# Patient Record
Sex: Male | Born: 1980 | Race: Black or African American | Hispanic: No | Marital: Married | Smoking: Current some day smoker
Health system: Southern US, Community
[De-identification: ages and names within clinical notes are randomized; demographics above are authoritative.]

## PROBLEM LIST (undated history)

## (undated) DIAGNOSIS — R519 Headache, unspecified: Secondary | ICD-10-CM

## (undated) DIAGNOSIS — R51 Headache: Secondary | ICD-10-CM

## (undated) HISTORY — PX: LASIK: SHX215

## (undated) HISTORY — PX: TONSILLECTOMY: SUR1361

---

## 2006-02-27 ENCOUNTER — Emergency Department: Payer: Self-pay | Admitting: Emergency Medicine

## 2008-12-22 ENCOUNTER — Emergency Department: Payer: Self-pay | Admitting: Emergency Medicine

## 2011-11-13 ENCOUNTER — Emergency Department: Payer: Self-pay | Admitting: *Deleted

## 2015-09-17 ENCOUNTER — Emergency Department
Admission: EM | Admit: 2015-09-17 | Discharge: 2015-09-17 | Disposition: A | Discharge status: Home / Self Care | Payer: Managed Care, Other (non HMO) | Attending: Emergency Medicine | Admitting: Emergency Medicine

## 2015-09-17 ENCOUNTER — Encounter: Payer: Self-pay | Admitting: *Deleted

## 2015-09-17 ENCOUNTER — Emergency Department: Payer: Managed Care, Other (non HMO)

## 2015-09-17 DIAGNOSIS — F17200 Nicotine dependence, unspecified, uncomplicated: Secondary | ICD-10-CM | POA: Diagnosis not present

## 2015-09-17 DIAGNOSIS — G44019 Episodic cluster headache, not intractable: Secondary | ICD-10-CM | POA: Diagnosis not present

## 2015-09-17 MED ORDER — SUMATRIPTAN 20 MG/ACT NA SOLN: 1.0000 | NASAL | Status: AC | PRN

## 2015-09-17 NOTE — ED Notes (Addendum)
Pt reports  A history of headaches, pt reports he has had a  headache for a week, pt describes headaches are the same as in the past

## 2015-09-17 NOTE — ED Notes (Addendum)
Headache behind right eye everyday since Thanksgiving. Dx with cluster headaches previously. Has followup with headache wellness clinic in [place] X 2 weeks from now.   No headache at this time. Pt alert and oriented X4, active, cooperative, pt in NAD. RR even and unlabored, color WNL.

## 2015-09-17 NOTE — ED Notes (Signed)
Pt discharged home.  Discharge instructions given to patient.  Pt voiced understanding.  No questions or concerns at this time.  Pt in NAD.  Pt items discharged with pt.  No items left in ED.

## 2015-09-17 NOTE — ED Provider Notes (Signed)
[facility] Emergency Department Provider Note     Time seen: ----------------------------------------- 6:22 PM on 09/17/2015 -----------------------------------------    I have reviewed the triage vital signs and the nursing notes.   HISTORY  Chief Complaint Headache    HPI Erskin Zinda is a 34 y.o. male who presents ER for headache every day since Thanksgiving. Patient states it happens at least twice a day, he has diagnosed himself with cluster headache, states the pain is behind his right eye, it tears up and seems to come and go on its own. Over-the-counter migraine medications have not helped. Currently does not have any headache. Patient states is worse when he wakes from sleep at night with headache.   History reviewed. No pertinent past medical history.  There are no active problems to display for this patient.   History reviewed. No pertinent past surgical history.  Allergies Review of patient's allergies indicates no known allergies.  Social History Social History  Substance Use Topics  . Smoking status: Current Some Day Smoker  . Smokeless tobacco: None  . Alcohol Use: No    Review of Systems Constitutional: Negative for fever. Eyes: Negative for visual changes. ENT: Negative for sore throat. Cardiovascular: Negative for chest pain. Respiratory: Negative for shortness of breath. Gastrointestinal: Negative for abdominal pain, vomiting and diarrhea. Genitourinary: Negative for dysuria. Musculoskeletal: Negative for back pain. Skin: Negative for rash. Neurological: Positive for headache  10-point ROS otherwise negative.  ____________________________________________   PHYSICAL EXAM:  VITAL SIGNS: ED Triage Vitals  Enc Vitals Group     BP 09/17/15 1635 140/84 mmHg     Pulse Rate 09/17/15 1635 82     Resp 09/17/15 1635 20     Temp 09/17/15 1635 97.8 F (36.6 C)     Temp Source 09/17/15 1635 Oral     SpO2 09/17/15 1635  100 %     Weight 09/17/15 1635 200 lb (90.719 kg)     Height 09/17/15 1635 6' (1.829 m)     Head Cir --      Peak Flow --      Pain Score 09/17/15 1636 0     Pain Loc --      Pain Edu? --      Excl. in GC? --     Constitutional: Alert and oriented. Well appearing and in no distress. Eyes: Conjunctivae are normal. PERRL. Normal extraocular movements. ENT   Head: Normocephalic and atraumatic.   Nose: No congestion/rhinnorhea.   Mouth/Throat: Mucous membranes are moist.   Neck: No stridor. Cardiovascular: Normal rate, regular rhythm. Normal and symmetric distal pulses are present in all extremities. No murmurs, rubs, or gallops. Respiratory: Normal respiratory effort without tachypnea nor retractions. Breath sounds are clear and equal bilaterally. No wheezes/rales/rhonchi. Gastrointestinal: Soft and nontender. No distention. No abdominal bruits.  Musculoskeletal: Nontender with normal range of motion in all extremities. No joint effusions.  No lower extremity tenderness nor edema. Neurologic:  Normal speech and language. No gross focal neurologic deficits are appreciated. Speech is normal. No gait instability. Skin:  Skin is warm, dry and intact. No rash noted. Psychiatric: Mood and affect are normal. Speech and behavior are normal. Patient exhibits appropriate insight and judgment. ____________________________________________  ED COURSE:  Pertinent labs & imaging results that were available during my care of the patient were reviewed by me and considered in my medical decision making (see chart for details). Patient is in no acute distress, will obtain CT imaging and reevaluate. ____________________________________________  RADIOLOGY  CT head Is unremarkable ____________________________________________  FINAL ASSESSMENT AND PLAN  Headache  Plan: Patient with labs and imaging as dictated above. Patient be given instructions for cluster headache, he has an  appointment with [place] in [place] in 2 weeks. I will try prescription for headache medication until he has time to follow-up.   Emily FilbertWilliams, Phoenyx Paulsen E, MD   Emily FilbertJonathan E Jaquala Fuller, MD 09/17/15 [phone]

## 2015-09-17 NOTE — Discharge Instructions (Signed)
Cluster Headache  Cluster headaches are recognized by their pattern of deep, intense head pain. They normally occur on one side of your head, but they may "switch sides" in subsequent episodes. Typically, cluster headaches:   · Are severe in nature.    · Occur repeatedly over weeks to months and are followed by periods of no headaches.    · Can last from 15 minutes to 3 hours.    · Occur at the same time each day, often at night.    · Occur several times a day.  CAUSES  The exact cause of cluster headaches is not known. Alcohol use may be associated with cluster headaches.  SIGNS AND SYMPTOMS   · Severe pain that begins in or around your eye or temple.    · One-sided head pain.    · Feeling sick to your stomach (nauseous).    · Sensitivity to light.    · Runny nose.    · Eye redness, tearing, and nasal stuffiness on the side of your head where you are experiencing pain.    · Sweaty, pale skin of the face.    · Droopy or swollen eyelid.    · Restlessness.  DIAGNOSIS   Cluster headaches are diagnosed based on symptoms and a physical exam. Your health care provider may order a CT scan or an MRI of your head or lab tests to see if your headaches are caused by other medical conditions.   TREATMENT   · Medicines for pain relief and to prevent recurrent attacks. Some people may need a combination of medicines.  · Oxygen for pain relief.    · Biofeedback programs to help reduce headache pain.    It may be helpful to keep a headache diary. This may help you find a trend for what is triggering your headaches. Your health care provider can develop a treatment plan.   HOME CARE INSTRUCTIONS   During cluster periods:   · Follow a regular sleep schedule. Do not vary the amount and time that you sleep from day to day. It is important to stay on the same schedule during a cluster period to help prevent headaches.    · Avoid alcohol.    · Stop smoking if you smoke.    SEEK MEDICAL CARE IF:  · You have any changes from your previous  cluster headaches either in intensity or frequency.    · You are not getting relief from medicines you are taking.    SEEK IMMEDIATE MEDICAL CARE IF:   · You faint.    · You have weakness or numbness, especially on one side of your body or face.    · You have double vision.    · You have nausea or vomiting that is not relieved within several hours.    · You cannot keep your balance or have difficulty talking or walking.    · You have neck pain or stiffness.    · You have a fever.  MAKE SURE YOU:  · Understand these instructions.    · Will watch your condition.    · Will get help right away if you are not doing well or get worse.     This information is not intended to replace advice given to you by your health care provider. Make sure you discuss any questions you have with your health care provider.     Document Released: 10/04/2005 Document Revised: 07/25/2013 Document Reviewed: 04/26/2013  Elsevier Interactive Patient Education ©2016 [employer]

## 2015-09-20 NOTE — ED Notes (Signed)
Patient called back, states he is out of Imitrex and pharmacy states it is too soon to have refilled. Dr. Mayford KnifeWilliams previously saw patient, is not here today. Spoke to Dr. Pershing ProudSchaevitz, who suggested patient come back to be seen if Imitrex has been ineffective for patient at frequency which patient is taking medication. Patient has follow up with headache [place] in less than 2 weeks. Advised patient he is welcome to return to ER for possible change in medication therapy, or may wait until specialist appointment.

## 2015-09-21 ENCOUNTER — Ambulatory Visit
Admission: EM | Admit: 2015-09-21 | Discharge: 2015-09-21 | Disposition: A | Discharge status: Home / Self Care | Payer: Managed Care, Other (non HMO) | Attending: Internal Medicine | Admitting: Internal Medicine

## 2015-09-21 ENCOUNTER — Encounter: Payer: Self-pay | Admitting: Gynecology

## 2015-09-21 DIAGNOSIS — G4452 New daily persistent headache (NDPH): Secondary | ICD-10-CM | POA: Diagnosis not present

## 2015-09-21 HISTORY — DX: Headache: R51

## 2015-09-21 HISTORY — DX: Headache, unspecified: R51.9

## 2015-09-21 MED ORDER — ZOLMITRIPTAN 5 MG NA SOLN: 1.0000 | NASAL | Status: AC | PRN

## 2015-09-21 MED ORDER — PREDNISONE 50 MG PO TABS: 50.0000 mg | ORAL_TABLET | Freq: Every day | ORAL | Status: AC

## 2015-09-21 NOTE — ED Notes (Signed)
Per patient stated was seen at ER on 09/17/15 for headace. Pt. Was given script for Sumatriptan 20mg  nasal spray Quantity 6. However, per patient uses it all up and was told it was too soon for his refill.

## 2015-09-21 NOTE — ED Provider Notes (Signed)
CSN: [id number]     Arrival date & time 09/21/15  0905 History   First MD Initiated Contact with Patient 09/21/15 1003     Chief Complaint  Patient presents with  . Headache   HPI  Patient is a 34 year old gentleman who started right after Thanksgiving with a daily headache, in the evening, and was evaluated in the emergency room on November 30. He has an appointment [facility] in the next week or so, and has been managing the headaches with daily use of Imitrex nasal, with excellent relief. Unfortunately, his insurance has declined to refill this medicine, since it as a rescue medicine. He is here to discuss options, until he gets into the headache Center. No weakness or clumsiness, currently not having a headache. Was able to walk independently into the urgent care.  Does have some history of headaches, but never this persistent or severe.    Past Medical History  Diagnosis Date  . Head ache    Past Surgical History  Procedure Laterality Date  . Tonsillectomy    . Lasik      Social History  Substance Use Topics  . Smoking status: Current Some Day Smoker  . Smokeless tobacco: None  . Alcohol Use: No    Review of Systems  All other systems reviewed and are negative.   Allergies  Review of patient's allergies indicates no known allergies.  Home Medications   Prior to Admission medications   Medication Sig Start Date End Date Taking? Authorizing Provider  SUMAtriptan (IMITREX) 20 MG/ACT nasal spray Place 1 spray (20 mg total) into the nose as needed for migraine or headache. May repeat in 2 hours if headache persists or recurs. 09/17/15  Yes Emily FilbertJonathan E Williams, MD                  BP 112/80 mmHg  Pulse 69  Temp(Src) 97.8 F (36.6 C) (Oral)  Resp 18  Ht 6' (1.829 m)  Wt 200 lb (90.719 kg)  BMI 27.12 kg/m2  SpO2 100%   Physical Exam  Constitutional: He is oriented to person, place, and time. No distress.  Alert, nicely  groomed Sitting up on the end of the exam table  HENT:  Head: Atraumatic.  Eyes:  Conjugate gaze, no eye redness/drainage  Neck: Neck supple.  Cardiovascular: Normal rate.   Pulmonary/Chest: No respiratory distress.  Abdominal: He exhibits no distension.  Musculoskeletal: Normal range of motion. He exhibits no edema.  Neurological: He is alert and oriented to person, place, and time.  Able to climb on/off exam table without difficulty, walked into the urgent care independently.  Skin: Skin is warm and dry.  No cyanosis  Nursing note and vitals reviewed.   ED Course  Procedures (including critical care time)    MDM   1. New persistent daily headache    New Prescriptions   PREDNISONE (DELTASONE) 50 MG TABLET    Take 1 tablet (50 mg total) by mouth daily.   ZOLMITRIPTAN (ZOMIG) 5 MG NASAL SOLUTION    Place 1 spray into the nose as needed for migraine.   Keep follow-up as planned with [facility]. Prescription for prednisone to try and break the headache, and prescription for alternate triptan given. Establish care with a primary care provider, given the name of Dr. Everlene OtherJayce Cook at [facility].     Eustace MooreLaura W Murray, MD 09/22/15 [phone]

## 2015-09-21 NOTE — Discharge Instructions (Signed)
Followup as planned with [facility].   Prescriptions for zomig and prednisone (to break headache cycle) sent to the [facility] in [place]. Recheck as needed.

## 2017-06-11 IMAGING — CT CT HEAD W/O CM
1 series · 16 of 30 positions shown, 20 images · non-contrast
Comparison: None.

CLINICAL DATA: Headaches behind the right eye since Thanksgiving.

EXAM:
CT HEAD WITHOUT CONTRAST
TECHNIQUE: Contiguous axial images were obtained from the base of the skull
through the vertex without intravenous contrast.

[Series 2: head wo · axial · 0.46mm/px · z∈[-31,+100]mm · 16 of 30 slices shown, 20 images]
[im 2/30  brain]
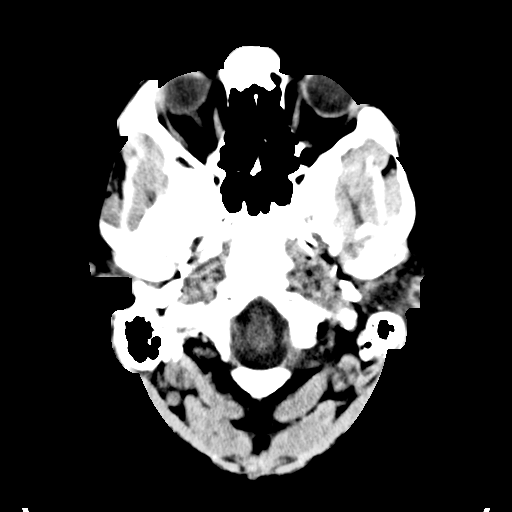
[im 2/30  bone]
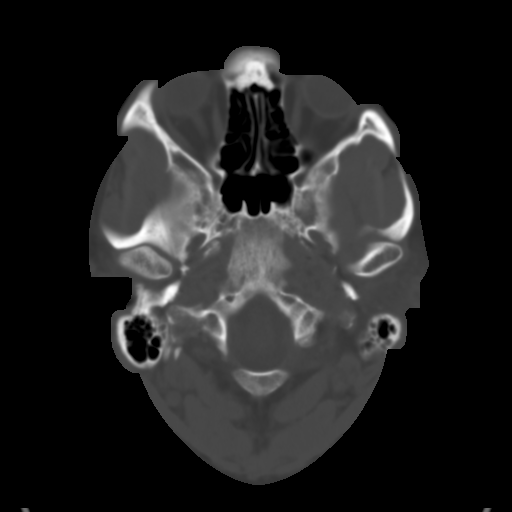
[im 4/30  brain]
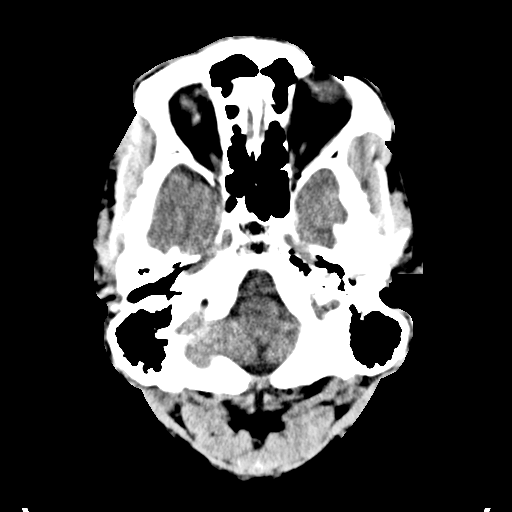
[im 6/30  brain]
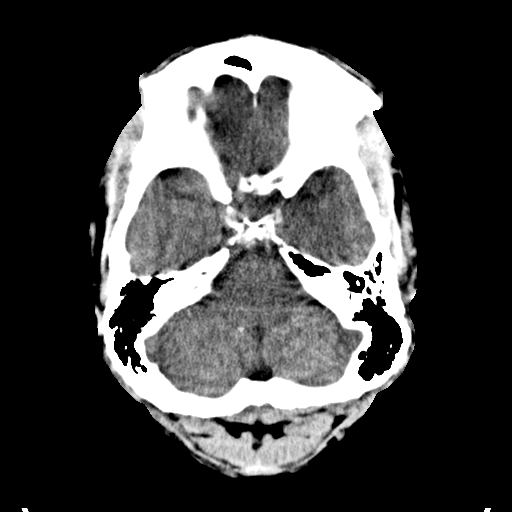
[im 8/30  brain]
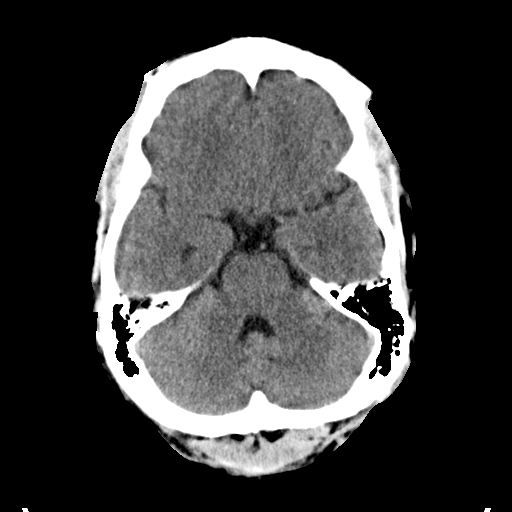
[im 9/30  brain]
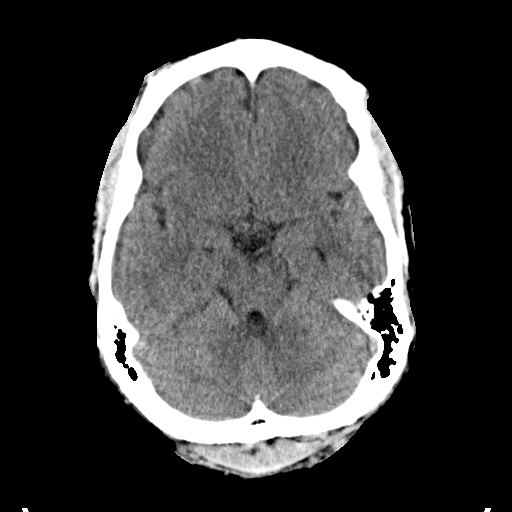
[im 9/30  bone]
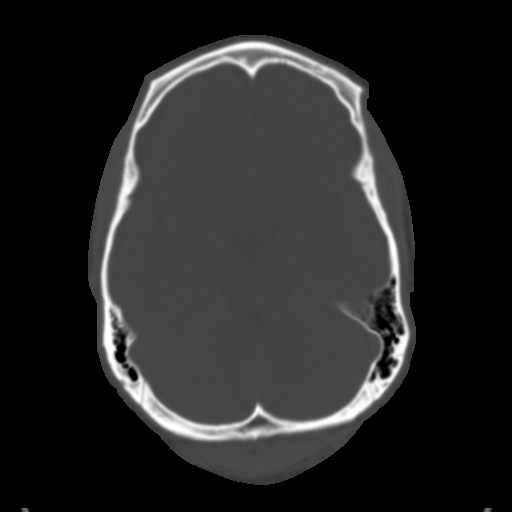
[im 11/30  brain]
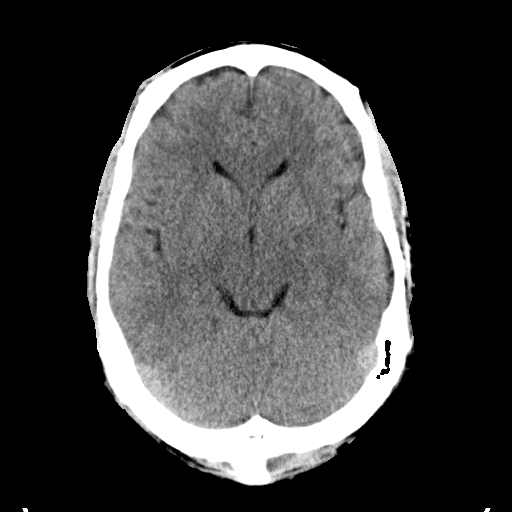
[im 13/30  brain]
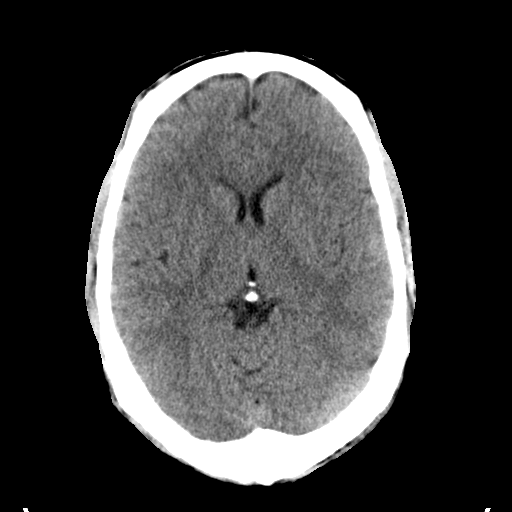
[im 15/30  brain]
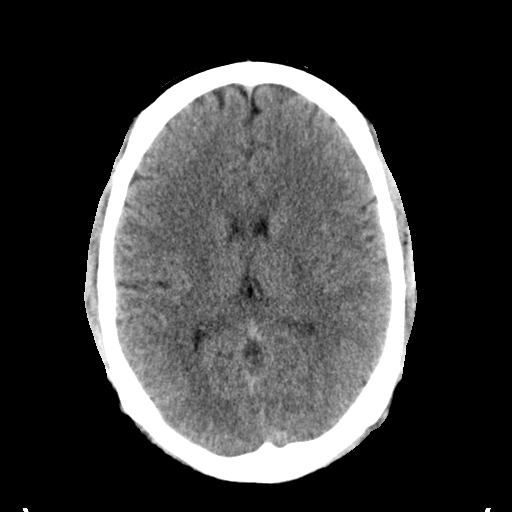
[im 16/30  brain]
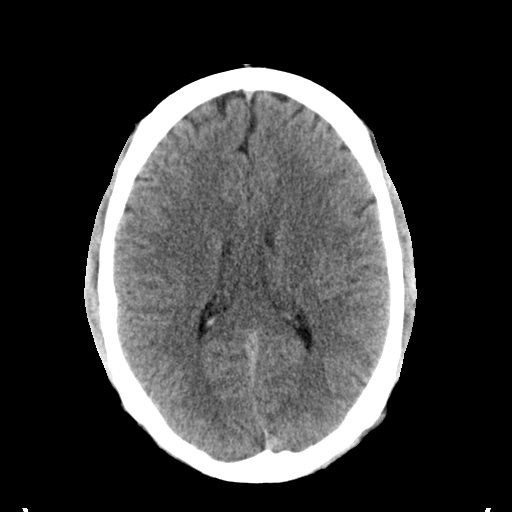
[im 16/30  bone]
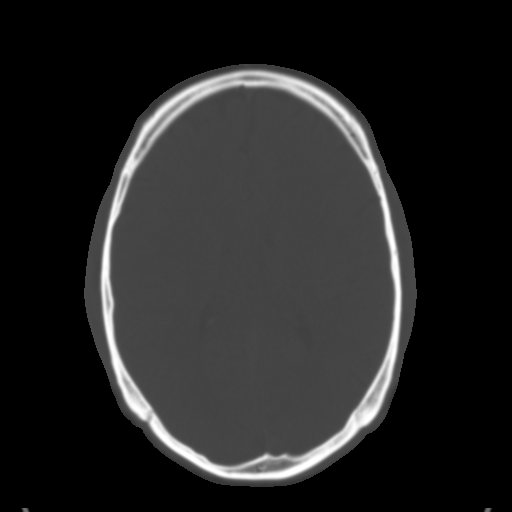
[im 18/30  brain]
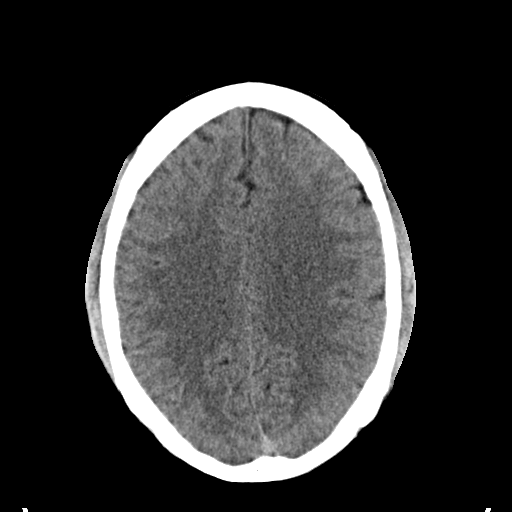
[im 20/30  brain]
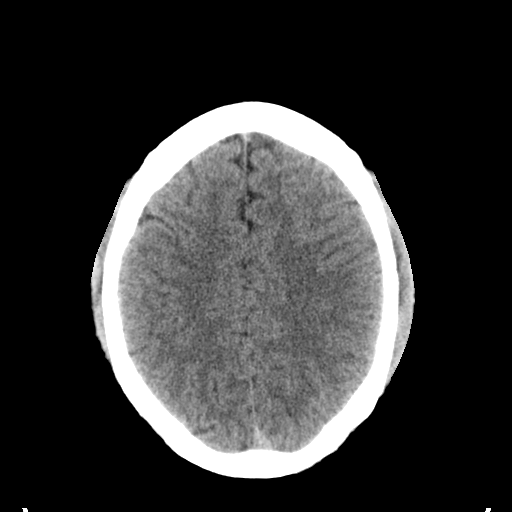
[im 22/30  brain]
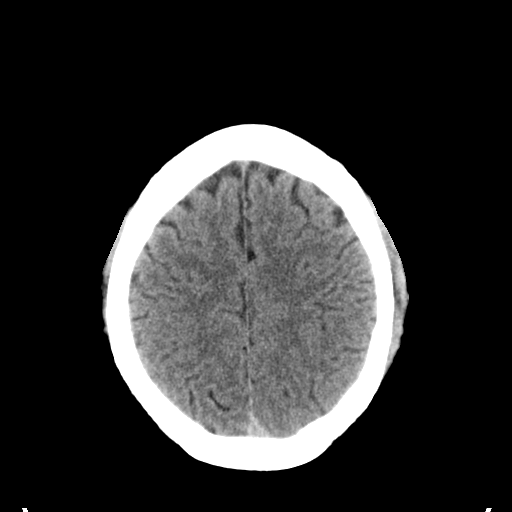
[im 23/30  brain]
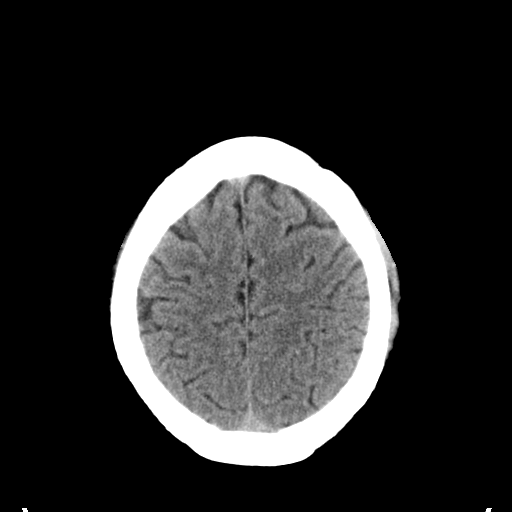
[im 23/30  bone]
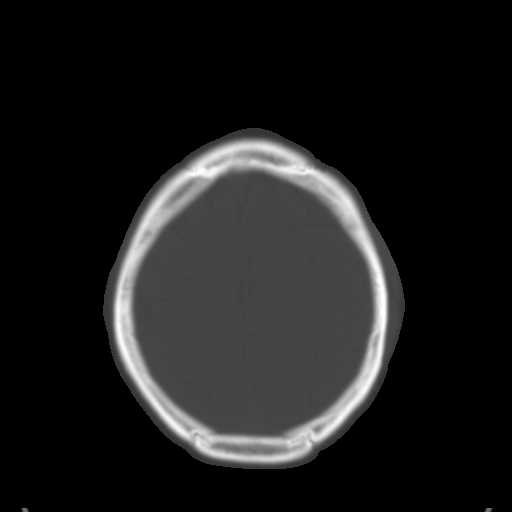
[im 25/30  brain]
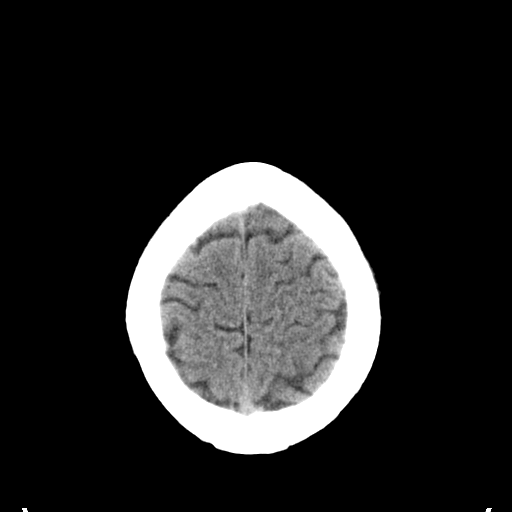
[im 27/30  brain]
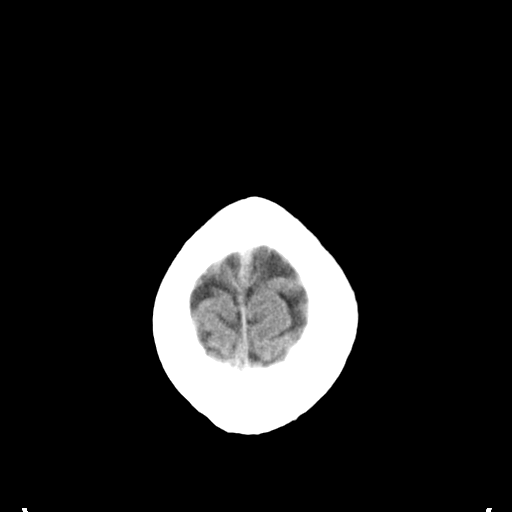
[im 29/30  brain]
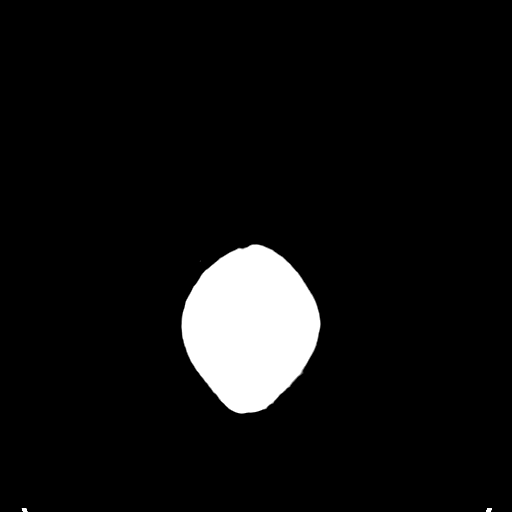

[16 of 30 positions shown; findings below may reference images not displayed]

FINDINGS: There is no midline shift, hydrocephalus, or mass. No acute
hemorrhage or acute transcortical infarct is identified. The bony
calvarium is intact. The visualized sinuses are clear.
IMPRESSION: Normal head CT.

## 2017-11-27 ENCOUNTER — Emergency Department
Admission: EM | Admit: 2017-11-27 | Discharge: 2017-11-27 | Disposition: A | Discharge status: Home / Self Care | Payer: Commercial Managed Care - PPO | Attending: Emergency Medicine | Admitting: Emergency Medicine

## 2017-11-27 DIAGNOSIS — Y929 Unspecified place or not applicable: Secondary | ICD-10-CM | POA: Diagnosis not present

## 2017-11-27 DIAGNOSIS — S3992XA Unspecified injury of lower back, initial encounter: Secondary | ICD-10-CM | POA: Diagnosis present

## 2017-11-27 DIAGNOSIS — Y939 Activity, unspecified: Secondary | ICD-10-CM | POA: Diagnosis not present

## 2017-11-27 DIAGNOSIS — F17210 Nicotine dependence, cigarettes, uncomplicated: Secondary | ICD-10-CM | POA: Diagnosis not present

## 2017-11-27 DIAGNOSIS — Y999 Unspecified external cause status: Secondary | ICD-10-CM | POA: Diagnosis not present

## 2017-11-27 DIAGNOSIS — S39012A Strain of muscle, fascia and tendon of lower back, initial encounter: Secondary | ICD-10-CM | POA: Diagnosis not present

## 2017-11-27 DIAGNOSIS — X58XXXA Exposure to other specified factors, initial encounter: Secondary | ICD-10-CM | POA: Diagnosis not present

## 2017-11-27 MED ORDER — DEXAMETHASONE SODIUM PHOSPHATE 10 MG/ML IJ SOLN
10.0000 mg | Freq: Once | INTRAMUSCULAR | Status: AC
  Administered 2017-11-27: 10 mg via INTRAMUSCULAR
  Filled 2017-11-27: qty 1

## 2017-11-27 MED ORDER — IBUPROFEN 800 MG PO TABS: 800.0000 mg | ORAL_TABLET | Freq: Three times a day (TID) | ORAL | 0 refills | Status: AC | PRN

## 2017-11-27 MED ORDER — CYCLOBENZAPRINE HCL 5 MG PO TABS: 5.0000 mg | ORAL_TABLET | Freq: Three times a day (TID) | ORAL | 0 refills | Status: AC | PRN

## 2017-11-27 MED ORDER — KETOROLAC TROMETHAMINE 30 MG/ML IJ SOLN
30.0000 mg | Freq: Once | INTRAMUSCULAR | Status: AC
  Administered 2017-11-27: 30 mg via INTRAMUSCULAR
  Filled 2017-11-27: qty 1

## 2017-11-27 MED ORDER — DIAZEPAM 5 MG PO TABS
5.0000 mg | ORAL_TABLET | Freq: Once | ORAL | Status: AC
  Administered 2017-11-27: 5 mg via ORAL
  Filled 2017-11-27: qty 1

## 2017-11-27 NOTE — ED Provider Notes (Signed)
[facility] EMERGENCY DEPARTMENT Provider Note   CSN: [id number] Arrival date & time: 11/27/17  1701     History   Chief Complaint Chief Complaint  Patient presents with  . Back Pain    HPI Stephen Dodson is a 37 y.o. male presents to the emergency department for evaluation of acute right lower back pain.  Patient states Friday he sneezed and felt a pulling sensation on the right lower back of the lumbar spine.  He denies any midline back pain.  He does describe tightness over the last 2 days without any numbness tingling or radicular symptoms.  He has been ambulatory.  He states he has no pain with sitting still but with attempting to lean to the left he has tightness on the right paravertebral muscles of the lumbar spine with moderate pain.  He has not had any medications for the pain.  He has been using icy hot with mild improvement.  He denies any urinary symptoms, abdominal pain, chest pain or shortness of breath.  HPI  Past Medical History:  Diagnosis Date  . Head ache     There are no active problems to display for this patient.   Past Surgical History:  Procedure Laterality Date  . LASIK    . TONSILLECTOMY         Home Medications    Prior to Admission medications   Medication Sig Start Date End Date Taking? Authorizing Provider  cyclobenzaprine (FLEXERIL) 5 MG tablet Take 1-2 tablets (5-10 mg total) by mouth 3 (three) times daily as needed for muscle spasms. 11/27/17   Evon Slack, PA-C  ibuprofen (ADVIL,MOTRIN) 800 MG tablet Take 1 tablet (800 mg total) by mouth every 8 (eight) hours as needed. 11/27/17   Evon Slack, PA-C  predniSONE (DELTASONE) 50 MG tablet Take 1 tablet (50 mg total) by mouth daily. 09/21/15   Isa Rankin, MD  SUMAtriptan [facility]) 20 MG/ACT nasal spray Place 1 spray (20 mg total) into the nose as needed for migraine or headache. May repeat in 2 hours if headache persists or recurs. 09/17/15   Emily Filbert, MD  zolmitriptan (ZOMIG) 5 MG nasal solution Place 1 spray into the nose as needed for migraine. 09/21/15   Isa Rankin, MD    Family History History reviewed. No pertinent family history.  Social History Social History   Tobacco Use  . Smoking status: Current Some Day Smoker  . Smokeless tobacco: Never Used  Substance Use Topics  . Alcohol use: No  . Drug use: No     Allergies   Patient has no known allergies.   Review of Systems Review of Systems  Constitutional: Negative for fever.  Respiratory: Negative for shortness of breath.   Cardiovascular: Negative for chest pain.  Gastrointestinal: Negative for abdominal pain.  Genitourinary: Negative for difficulty urinating, dysuria and urgency.  Musculoskeletal: Positive for back pain and myalgias. Negative for gait problem and neck pain.  Skin: Negative for rash.  Neurological: Negative for dizziness, numbness and headaches.     Physical Exam Updated Vital Signs BP 134/77 (BP Location: Left Arm)   Pulse (!) 101   Temp 98.8 F (37.1 C) (Oral)   Resp 18   Ht 6' (1.829 m)   Wt 90.7 kg (200 lb)   SpO2 99%   BMI 27.12 kg/m   Physical Exam  Constitutional: He appears well-developed and well-nourished.  HENT:  Head: Normocephalic and atraumatic.  Eyes: Conjunctivae are normal.  Neck: Neck supple.  Cardiovascular: Normal rate and regular rhythm.  No murmur heard. Pulmonary/Chest: Effort normal and breath sounds normal. No respiratory distress.  Abdominal: Soft. There is no tenderness.  Musculoskeletal: He exhibits no edema.  Lumbar Spine: Examination of the lumbar spine reveals no bony abnormality, no edema, and no ecchymosis.  There is no step off.  The patient has full range of motion of the lumbar spine with flexion and extension.  The patient has normal lateral bend and rotation.  The patient has no pain with range of motion activities. Patient is nontender along the spinous process.  He has  mild tightness and tenderness along the right paravertebral muscles of the lumbar spine the patient is non tender along the iliac crest.  The patient is non tender in the sciatic notch.  The patient is non tender along the Sacroiliac joint.  There is no Coccyx joint tenderness.    Bilateral Lower Extremities: Examination of the lower extremities reveals no bony abnormality, no edema, and no ecchymosis.  The patient has full active and passive range of motion of the hips, knees, and ankles.  There is no discomfort with range of motion exercises.  The patient is non tender along the greater trochanter region.  The patient has a negative Denna Haggard' test bilaterally.  There is normal skin warmth.  There is normal capillary refill bilaterally.    Neurologic: The patient has a negative straight leg raise.  The patient has normal muscle strength testing for the quadriceps, calves, ankle dorsiflexion, ankle plantarflexion, and extensor hallicus longus.  The patient has sensation that is intact to light touch.  Neurological: He is alert.  Skin: Skin is warm and dry.  Psychiatric: He has a normal mood and affect.  Nursing note and vitals reviewed.    ED Treatments / Results  Labs (all labs ordered are listed, but only abnormal results are displayed) Labs Reviewed - No data to display  EKG  EKG Interpretation None       Radiology No results found.  Procedures Procedures (including critical care time)  Medications Ordered in ED Medications  ketorolac (TORADOL) 30 MG/ML injection 30 mg (30 mg Intramuscular Given 11/27/17 1808)  dexamethasone (DECADRON) injection 10 mg (10 mg Intramuscular Given 11/27/17 1807)  diazepam (VALIUM) tablet 5 mg (5 mg Oral Given 11/27/17 1808)     Initial Impression / Assessment and Plan / ED Course  I have reviewed the triage vital signs and the nursing notes.  Pertinent labs & imaging results that were available during my care of the patient were reviewed by me  and considered in my medical decision making (see chart for details).     37 year old male with acute right lower back pain consistent with muscular strain/spasm.  Pain significantly improved with Toradol, dexamethasone IM along with Valium p.o.  He is given a prescription for ibuprofen and Flexeril to take at home.  Patient will rest, avoid any heavy lifting pushing or pulling.  He will walk to help keep the muscles loose.  He is educated on signs and symptoms return to ED for.  Patient will follow with orthopedics if no improvement in 1 week.  Final Clinical Impressions(s) / ED Diagnoses   Final diagnoses:  Strain of lumbar region, initial encounter    ED Discharge Orders        Ordered    ibuprofen (ADVIL,MOTRIN) 800 MG tablet  Every 8 hours PRN     11/27/17 1829    cyclobenzaprine (FLEXERIL) 5  MG tablet  3 times daily PRN     11/27/17 1829       Ronnette JuniperGaines, Thomas C, PA-C 11/27/17 1842    Merrily Brittleifenbark, Neil, MD 11/28/17 [phone]

## 2017-11-27 NOTE — ED Triage Notes (Signed)
Pt reports right sided back pain extending to front of right side. Pt reports feeling pain after a sneeze. Reports pain worse with movement. Denies specific injury.

## 2017-11-27 NOTE — Discharge Instructions (Signed)
Please take ibuprofen and Flexeril as prescribed.  Please try to walk to help loosen the lower back muscles.  Return to the ER for any worsening symptoms or urgent changes in your health.
